# Patient Record
Sex: Female | Born: 1970 | Race: White | Hispanic: No | Smoking: Former smoker
Health system: Southern US, Community
[De-identification: ages and names within clinical notes are randomized; demographics above are authoritative.]

## PROBLEM LIST (undated history)

## (undated) DIAGNOSIS — F329 Major depressive disorder, single episode, unspecified: Secondary | ICD-10-CM

## (undated) DIAGNOSIS — J189 Pneumonia, unspecified organism: Secondary | ICD-10-CM

## (undated) DIAGNOSIS — E079 Disorder of thyroid, unspecified: Secondary | ICD-10-CM

## (undated) DIAGNOSIS — G43909 Migraine, unspecified, not intractable, without status migrainosus: Secondary | ICD-10-CM

## (undated) DIAGNOSIS — F32A Depression, unspecified: Secondary | ICD-10-CM

## (undated) DIAGNOSIS — R7303 Prediabetes: Secondary | ICD-10-CM

## (undated) HISTORY — PX: TONSILLECTOMY: SUR1361

## (undated) HISTORY — PX: CHOLECYSTECTOMY: SHX55

## (undated) HISTORY — PX: UPPER GASTROINTESTINAL ENDOSCOPY: SHX188

---
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
## 2009-07-30 HISTORY — PX: ANKLE SURGERY: SHX546

## 2019-05-16 ENCOUNTER — Emergency Department (HOSPITAL_BASED_OUTPATIENT_CLINIC_OR_DEPARTMENT_OTHER)
Admission: EM | Admit: 2019-05-16 | Discharge: 2019-05-16 | Disposition: A | Discharge status: Home / Self Care | Payer: Self-pay | Attending: Emergency Medicine | Admitting: Emergency Medicine

## 2019-05-16 ENCOUNTER — Emergency Department (HOSPITAL_BASED_OUTPATIENT_CLINIC_OR_DEPARTMENT_OTHER): Payer: Self-pay

## 2019-05-16 ENCOUNTER — Other Ambulatory Visit: Payer: Self-pay

## 2019-05-16 ENCOUNTER — Encounter (HOSPITAL_BASED_OUTPATIENT_CLINIC_OR_DEPARTMENT_OTHER): Payer: Self-pay | Admitting: Adult Health

## 2019-05-16 DIAGNOSIS — W010XXA Fall on same level from slipping, tripping and stumbling without subsequent striking against object, initial encounter: Secondary | ICD-10-CM | POA: Insufficient documentation

## 2019-05-16 DIAGNOSIS — S82892A Other fracture of left lower leg, initial encounter for closed fracture: Secondary | ICD-10-CM | POA: Insufficient documentation

## 2019-05-16 DIAGNOSIS — Y9301 Activity, walking, marching and hiking: Secondary | ICD-10-CM | POA: Insufficient documentation

## 2019-05-16 DIAGNOSIS — Y999 Unspecified external cause status: Secondary | ICD-10-CM | POA: Insufficient documentation

## 2019-05-16 DIAGNOSIS — Z87891 Personal history of nicotine dependence: Secondary | ICD-10-CM | POA: Insufficient documentation

## 2019-05-16 DIAGNOSIS — Y92828 Other wilderness area as the place of occurrence of the external cause: Secondary | ICD-10-CM | POA: Insufficient documentation

## 2019-05-16 MED ORDER — OXYCODONE-ACETAMINOPHEN 5-325 MG PO TABS: 1.0000 | ORAL_TABLET | ORAL | 0 refills | Status: DC | PRN

## 2019-05-16 MED ORDER — ONDANSETRON 4 MG PO TBDP: 4.0000 mg | ORAL_TABLET | Freq: Three times a day (TID) | ORAL | 0 refills | Status: AC | PRN

## 2019-05-16 MED ORDER — OXYCODONE-ACETAMINOPHEN 5-325 MG PO TABS
1.0000 | ORAL_TABLET | Freq: Once | ORAL | Status: AC
  Administered 2019-05-16: 18:00:00 1 via ORAL
  Filled 2019-05-16: qty 1

## 2019-05-16 MED ORDER — ONDANSETRON 4 MG PO TBDP
4.0000 mg | ORAL_TABLET | Freq: Once | ORAL | Status: AC
  Administered 2019-05-16: 18:00:00 4 mg via ORAL
  Filled 2019-05-16: qty 1

## 2019-05-16 NOTE — ED Notes (Signed)
Patient transported to CT 

## 2019-05-16 NOTE — Discharge Instructions (Signed)
You can take 1000 mg of Tylenol.  Do not exceed 4000 mg of Tylenol a day.  Take pain medications as directed for break through pain. Do not drive or operate machinery while taking this medication.   Do not get your splint wet.  Elevate the foot at home.  Use crutches as you should be nonweightbearing.  As we discussed, you will need to follow-up with referred orthopedic doctor.  Call his office and arrange for an appointment on Tuesday.  Return the emergency department for any worsening pain, numbness/weakness of the foot, discoloration of toes, fever or any other worsening or concerning symptoms.

## 2019-05-16 NOTE — ED Triage Notes (Signed)
Carolyn Green was hiking at [facility] and she had to jump over a 2 foot drop, she did not quit make the jump and landed on her left ankle. She has lateral ankle swlling and bruising. Pain is not severe unless foot is down or weight bearing.

## 2019-05-16 NOTE — ED Notes (Signed)
Pt states that she took 3 ibuprofen around 1130 today after injury. Pt was jumping from a stone stair while hiking and heard a pop. Denies hitting head or any LOC.

## 2019-05-16 NOTE — ED Provider Notes (Signed)
[facility] EMERGENCY DEPARTMENT Provider Note   CSN: [id number] Arrival date & time: 05/16/19  1700     History   Chief Complaint Chief Complaint  Patient presents with  . Ankle Pain    HPI Carolyn Green is a 48 y.o. female who presents for evaluation of left ankle pain after mechanical fall.  She reports that she was hiking and noted that there was a drop that was about 2 feet.  She reports that she jumped the drop and landed wrong, causing her ankle to turn.  Has not been able to ambulate or bear weight since the incident.  No preceding chest pain or dizziness ammeter fall.  She did not have any head injury, LOC.  She denies any numbness/weakness.     The history is provided by the patient.    History reviewed. No pertinent past medical history.  There are no active problems to display for this patient.   Past Surgical History:  Procedure Laterality Date  . ANKLE SURGERY    . CHOLECYSTECTOMY    . TONSILLECTOMY       OB History   No obstetric history on file.      Home Medications    Prior to Admission medications   Medication Sig Start Date End Date Taking? Authorizing Provider  ondansetron (ZOFRAN ODT) 4 MG disintegrating tablet Take 1 tablet (4 mg total) by mouth every 8 (eight) hours as needed for nausea or vomiting. 05/16/19   Maxwell CaulLayden, Keviana Guida A, PA-C  oxyCODONE-acetaminophen (PERCOCET/ROXICET) 5-325 MG tablet Take 1-2 tablets by mouth every 4 (four) hours as needed for severe pain. 05/16/19   Maxwell CaulLayden, Reshad Saab A, PA-C    Family History History reviewed. No pertinent family history.  Social History Social History   Tobacco Use  . Smoking status: Former Smoker  Substance Use Topics  . Alcohol use: Yes  . Drug use: Never     Allergies   Penicillins and Zyban [bupropion]   Review of Systems Review of Systems  Musculoskeletal:       Ankle pain  Neurological: Negative for weakness and numbness.  All other systems reviewed and are  negative.    Physical Exam Updated Vital Signs BP 114/72 (BP Location: Left Arm)   Pulse 73   Temp 98.1 F (36.7 C) (Oral)   Resp 16   Ht 5\' 7"  (1.702 m)   Wt 106.1 kg   SpO2 99%   BMI 36.65 kg/m   Physical Exam Vitals signs and nursing note reviewed.  Constitutional:      Appearance: She is well-developed.  HENT:     Head: Normocephalic and atraumatic.  Eyes:     General: No scleral icterus.       Right eye: No discharge.        Left eye: No discharge.     Conjunctiva/sclera: Conjunctivae normal.  Cardiovascular:     Pulses:          Dorsalis pedis pulses are 2+ on the right side and 2+ on the left side.  Pulmonary:     Effort: Pulmonary effort is normal.  Musculoskeletal:     Comments: Tenderness palpation noted to lateral and medial malleolus of left ankle with overlying soft tissue swelling.  Limited range of motion secondary to pain.  No palpable deformity or crepitus.  She is able to wiggle all 5 toes without any difficulty.  No tenderness palpation noted to proximal tib-fib, left knee, left hip.  No pelvic instability.  No tenderness  palpation of the right lower extremity.  Skin:    General: Skin is warm and dry.     Comments: Good distal cap refill. LLE is not dusky in appearance or cool to touch.  Neurological:     Mental Status: She is alert.     Comments: Sensation intact along major nerve distributions of BLE  Psychiatric:        Speech: Speech normal.        Behavior: Behavior normal.      ED Treatments / Results  Labs (all labs ordered are listed, but only abnormal results are displayed) Labs Reviewed - No data to display  EKG None  Radiology Dg Ankle Complete Left  Result Date: 05/16/2019 CLINICAL DATA:  Fall.  Pain. EXAM: LEFT ANKLE COMPLETE - 3+ VIEW COMPARISON:  None. FINDINGS: Two screws traverse the fibula, terminating in the tibia. The more inferior of these 2 screws is fractured. 2 screws traverse the medial malleolus as well. There is  a fracture of the fibula just distal to the screws described above. There is calcification in the soft tissue adjacent to the inferior tip of the medial malleolus consistent with an age indeterminate avulsion injury. There is soft tissue swelling, particularly laterally. The ankle mortise is intact. There is a subtle lucency through the base of the medial malleolus seen on oblique imaging. The screws traverse this lucency. IMPRESSION: 1. The inferior most screw traversing the fibula, terminating in the tibia is fractured. This is an age indeterminate finding. 2. There is a mildly displaced acute fracture through the distal fibula, just distal to the inferior-most fractured screw. 3. There is evidence of a medial malleolar avulsion injury which is age indeterminate. 4. A subtle lucency extends through the base of the medial malleolus. Two screws extend through this subtle lucency. It is possible this subtle lucency is from the patient's previously repaired fracture. Electronically Signed   By: Gerome Sam III M.D   On: 05/16/2019 17:56    Procedures Procedures (including critical care time)  Medications Ordered in ED Medications  ondansetron (ZOFRAN-ODT) disintegrating tablet 4 mg (4 mg Oral Given 05/16/19 1816)  oxyCODONE-acetaminophen (PERCOCET/ROXICET) 5-325 MG per tablet 1 tablet (1 tablet Oral Given 05/16/19 1816)     Initial Impression / Assessment and Plan / ED Course  I have reviewed the triage vital signs and the nursing notes.  Pertinent labs & imaging results that were available during my care of the patient were reviewed by me and considered in my medical decision making (see chart for details).        48 year old female who presents for evaluation of left ankle pain and swelling after mechanical fall.  Reports that she was hiking and missed a drop-off, causing her to land funny on her foot.  No head injury, LOC.  Has been unable to ambulate or bear weight since then. Patient is  afebrile, non-toxic appearing, sitting comfortably on examination table. Vital signs reviewed and stable. Patient is neurovascularly intact.  On exam, tenderness palpation noted to lateral and medial malleolus of left ankle with overlying soft tissue swelling.  Concern for fracture versus dislocation.  She has no proximal tib-fib tenderness or hip tenderness.  X-rays ordered at triage.  X-ray of ankle shows the inferior most screw the transverse the fibula and ends in the tibia is fractured.  Questionable age though.  She has an acute fracture through the distal fibula and a medial malleolar avulsion injury.  She had the surgery done in  [place] in 2011 or 2012.  She does not follow-up with our orthopedic and now lives in [place] area.  Consult orthopedics.  Discussed patient with Dr. Griffin Basil (Ortho).  He would like patient to be nonweightbearing with posterior splinting with slab.  He wants patient to follow-up in his office on Tuesday.  He would like a CT scan done here in the ED before she discharged.  Reevaluation after splint placement.  Patient with good distal sensation and cap refill.  Patient is hemodynamically stable.  Encourage patient to follow-up with Ortho as directed. At this time, patient exhibits no emergent life-threatening condition that require further evaluation in ED or admission. Patient had ample opportunity for questions and discussion. All patient's questions were answered with full understanding. Strict return precautions discussed. Patient expresses understanding and agreement to plan.   Portions of this note were generated with Lobby[employer]. Dictation errors may occur despite best attempts at proofreading.   Final Clinical Impressions(s) / ED Diagnoses   Final diagnoses:  Closed fracture of left ankle, initial encounter    ED Discharge Orders         Ordered    ondansetron (ZOFRAN ODT) 4 MG disintegrating tablet  Every 8 hours PRN     05/16/19 1919     oxyCODONE-acetaminophen (PERCOCET/ROXICET) 5-325 MG tablet  Every 4 hours PRN     05/16/19 1919           Desma Mcgregor 05/16/19 1920    Drenda Freeze, MD 05/16/19 [id number]

## 2019-05-19 ENCOUNTER — Other Ambulatory Visit (HOSPITAL_COMMUNITY)
Admission: RE | Admit: 2019-05-19 | Discharge: 2019-05-19 | Disposition: A | Discharge status: Home / Self Care | Payer: Medicaid Other | Source: Ambulatory Visit | Attending: Orthopaedic Surgery | Admitting: Orthopaedic Surgery

## 2019-05-19 ENCOUNTER — Encounter (HOSPITAL_COMMUNITY): Payer: Self-pay | Admitting: Emergency Medicine

## 2019-05-19 ENCOUNTER — Other Ambulatory Visit: Payer: Self-pay

## 2019-05-19 DIAGNOSIS — Z20828 Contact with and (suspected) exposure to other viral communicable diseases: Secondary | ICD-10-CM | POA: Insufficient documentation

## 2019-05-19 DIAGNOSIS — Z01812 Encounter for preprocedural laboratory examination: Secondary | ICD-10-CM | POA: Insufficient documentation

## 2019-05-19 LAB — SARS CORONAVIRUS 2 (TAT 6-24 HRS): SARS Coronavirus 2: NEGATIVE

## 2019-05-19 NOTE — H&P (Signed)
PREOPERATIVE H&P  Chief Complaint: LEFT DISTAL FIBULA FRACTURE  HPI: Carolyn Green is a 48 y.o. female who presents for preoperative history and physical prior to scheduled surgery, OPEN REDUCTION INTERNAL FIXATION (ORIF) ANKLE FRACTURE with HARDWARE REMOVAL.  She had previous history of ORIF left ankle by an orthopedic surgery in [place], [place] in 2011/2012 per the patient. Patient presented to the ED on 05/16/19 following a mechanical fall. She was unable to ambulate or bear weight following the fall. She denied any other injury. She was placed in a posterior splint and instructed to remain NWB on her LLE.   Patient was seen in clinic on 05/19/2019. Her symptoms are rated as moderate to severe, and have been worsening.  This is significantly impairing activities of daily living.   Please see clinic note for further details on this patient's care.    She has elected for surgical management.   No past medical history on file. Past Surgical History:  Procedure Laterality Date  . ANKLE SURGERY    . CHOLECYSTECTOMY    . TONSILLECTOMY     Social History   Socioeconomic History  . Marital status: Widowed    Spouse name: Not on file  . Number of children: Not on file  . Years of education: Not on file  . Highest education level: Not on file  Occupational History  . Not on file  Social Needs  . Financial resource strain: Not on file  . Food insecurity    Worry: Not on file    Inability: Not on file  . Transportation needs    Medical: Not on file    Non-medical: Not on file  Tobacco Use  . Smoking status: Former Smoker  Substance and Sexual Activity  . Alcohol use: Yes  . Drug use: Never  . Sexual activity: Not on file  Lifestyle  . Physical activity    Days per week: Not on file    Minutes per session: Not on file  . Stress: Not on file  Relationships  . Social Herbalist on phone: Not on file    Gets together: Not on file    Attends religious service: Not on  file    Active member of club or organization: Not on file    Attends meetings of clubs or organizations: Not on file    Relationship status: Not on file  Other Topics Concern  . Not on file  Social History Narrative  . Not on file   No family history on file. Allergies  Allergen Reactions  . Penicillins   . Zyban [Bupropion]    Prior to Admission medications   Medication Sig Start Date End Date Taking? Authorizing Provider  ondansetron (ZOFRAN ODT) 4 MG disintegrating tablet Take 1 tablet (4 mg total) by mouth every 8 (eight) hours as needed for nausea or vomiting. 05/16/19   Volanda Napoleon, PA-C  oxyCODONE-acetaminophen (PERCOCET/ROXICET) 5-325 MG tablet Take 1-2 tablets by mouth every 4 (four) hours as needed for severe pain. 05/16/19   Volanda Napoleon, PA-C    Positive ROS: All other systems have been reviewed and were otherwise negative with the exception of those mentioned in the HPI and as above.  Physical Exam: General: Alert, no acute distress Cardiovascular: No pedal edema Respiratory: No cyanosis, no use of accessory musculature GI: No organomegaly, abdomen is soft and non-tender Skin: No lesions in the area of chief complaint Neurologic: Sensation intact distally Psychiatric: Patient is competent for consent  with normal mood and affect Lymphatic: No axillary or cervical lymphadenopathy  MUSCULOSKELETAL: splint CDI, +EHL though remainder of motor difficult to test due to splint, sensation intact distally with warm well perfused foot  Imaging: X-rays and CT images were reviewed. Images demonstrate Status post screw fixation of the medial malleolus and across the syndesmosis. The inferior-most syndesmotic screw appears to be fractured. Acute comminuted mildly displaced distal fibular fracture at the level of the ankle mortise. Tiny anterior, medial malleolus avulsion injury.  Assessment: LEFT DISTAL FIBULA FRACTURE  Plan: Plan for Procedure(s): OPEN REDUCTION  INTERNAL FIXATION (ORIF) ANKLE FRACTURE HARDWARE REMOVAL  The risks benefits and alternatives were discussed with the patient including but not limited to the risks of nonoperative treatment, versus surgical intervention including infection, bleeding, nerve injury,  blood clots, cardiopulmonary complications, morbidity, mortality, among others, and they were willing to proceed.   The patient acknowledged the explanation, agreed to proceed with the plan and consent was signed.   Jerald Kief [place], [place]  05/19/2019 2:17 PM

## 2019-05-19 NOTE — Progress Notes (Addendum)
PCP - Marda Stalker, PA-C Cardiologist -   Chest x-ray -  EKG -  Stress Test -  ECHO -  Cardiac Cath -   Sleep Study -  CPAP - hx of OSA, was prescribed CPAP but does not use   Fasting Blood Sugar - prediabetic  Checks Blood Sugar ___0__ times a day  Blood Thinner Instructions: Aspirin Instructions: Last Dose:  Anesthesia review:   N/A  Patient denies shortness of breath, fever, cough and chest pain during preop phone call    Patient verbalized understanding of instructions that were given to them during preop phone call . Patient was also instructed that they will need to review over the PAT instructions again at home before surgery.

## 2019-05-19 NOTE — Progress Notes (Signed)
Pre-op phone  call attempted. No answer. Lmtcb

## 2019-05-20 ENCOUNTER — Ambulatory Visit (HOSPITAL_COMMUNITY): Payer: Self-pay | Admitting: Certified Registered Nurse Anesthetist

## 2019-05-20 ENCOUNTER — Encounter (HOSPITAL_COMMUNITY)
Admission: RE | Disposition: A | Discharge status: Home / Self Care | Payer: Self-pay | Source: Home / Self Care | Attending: Orthopaedic Surgery

## 2019-05-20 ENCOUNTER — Encounter (HOSPITAL_COMMUNITY): Payer: Self-pay | Admitting: Certified Registered Nurse Anesthetist

## 2019-05-20 ENCOUNTER — Ambulatory Visit (HOSPITAL_COMMUNITY)
Admission: RE | Admit: 2019-05-20 | Discharge: 2019-05-20 | Disposition: A | Discharge status: Home / Self Care | Payer: Self-pay | Attending: Orthopaedic Surgery | Admitting: Orthopaedic Surgery

## 2019-05-20 DIAGNOSIS — S82832A Other fracture of upper and lower end of left fibula, initial encounter for closed fracture: Secondary | ICD-10-CM | POA: Insufficient documentation

## 2019-05-20 DIAGNOSIS — Z7982 Long term (current) use of aspirin: Secondary | ICD-10-CM | POA: Insufficient documentation

## 2019-05-20 DIAGNOSIS — T84117A Breakdown (mechanical) of internal fixation device of bone of left lower leg, initial encounter: Secondary | ICD-10-CM | POA: Insufficient documentation

## 2019-05-20 DIAGNOSIS — Z87891 Personal history of nicotine dependence: Secondary | ICD-10-CM | POA: Insufficient documentation

## 2019-05-20 DIAGNOSIS — Z9889 Other specified postprocedural states: Secondary | ICD-10-CM | POA: Insufficient documentation

## 2019-05-20 DIAGNOSIS — W19XXXA Unspecified fall, initial encounter: Secondary | ICD-10-CM | POA: Insufficient documentation

## 2019-05-20 DIAGNOSIS — Z79890 Hormone replacement therapy (postmenopausal): Secondary | ICD-10-CM | POA: Insufficient documentation

## 2019-05-20 DIAGNOSIS — F329 Major depressive disorder, single episode, unspecified: Secondary | ICD-10-CM | POA: Insufficient documentation

## 2019-05-20 DIAGNOSIS — Y831 Surgical operation with implant of artificial internal device as the cause of abnormal reaction of the patient, or of later complication, without mention of misadventure at the time of the procedure: Secondary | ICD-10-CM | POA: Insufficient documentation

## 2019-05-20 DIAGNOSIS — Z791 Long term (current) use of non-steroidal anti-inflammatories (NSAID): Secondary | ICD-10-CM | POA: Insufficient documentation

## 2019-05-20 DIAGNOSIS — E039 Hypothyroidism, unspecified: Secondary | ICD-10-CM | POA: Insufficient documentation

## 2019-05-20 DIAGNOSIS — Z7984 Long term (current) use of oral hypoglycemic drugs: Secondary | ICD-10-CM | POA: Insufficient documentation

## 2019-05-20 DIAGNOSIS — Z79899 Other long term (current) drug therapy: Secondary | ICD-10-CM | POA: Insufficient documentation

## 2019-05-20 HISTORY — DX: Depression, unspecified: F32.A

## 2019-05-20 HISTORY — DX: Migraine, unspecified, not intractable, without status migrainosus: G43.909

## 2019-05-20 HISTORY — PX: HARDWARE REMOVAL: SHX979

## 2019-05-20 HISTORY — DX: Prediabetes: R73.03

## 2019-05-20 HISTORY — DX: Disorder of thyroid, unspecified: E07.9

## 2019-05-20 HISTORY — PX: ORIF ANKLE FRACTURE: SHX5408

## 2019-05-20 LAB — BASIC METABOLIC PANEL
Anion gap: 12 (ref 5–15)
BUN: 12 mg/dL (ref 6–20)
CO2: 21 mmol/L — ABNORMAL LOW (ref 22–32)
Calcium: 8.9 mg/dL (ref 8.9–10.3)
Chloride: 105 mmol/L (ref 98–111)
Creatinine, Ser: 0.82 mg/dL (ref 0.44–1.00)
GFR calc Af Amer: >60 mL/min (ref >60)
GFR calc non Af Amer: >60 mL/min (ref >60)
Glucose, Bld: 122 mg/dL — ABNORMAL HIGH (ref 70–99)
Potassium: 4.5 mmol/L (ref 3.5–5.1)
Sodium: 138 mmol/L (ref 135–145)

## 2019-05-20 LAB — CBC
HCT: 41 % (ref 36.0–46.0)
Hemoglobin: 12.9 g/dL (ref 12.0–15.0)
MCH: 28.9 pg (ref 26.0–34.0)
MCHC: 31.5 g/dL (ref 30.0–36.0)
MCV: 91.9 fL (ref 80.0–100.0)
Platelets: 253 10*3/uL (ref 150–400)
RBC: 4.46 MIL/uL (ref 3.87–5.11)
RDW: 13.4 % (ref 11.5–15.5)
WBC: 7.5 10*3/uL (ref 4.0–10.5)
nRBC: 0 % (ref 0.0–0.2)

## 2019-05-20 LAB — HEMOGLOBIN A1C
Hgb A1c MFr Bld: 6 % — ABNORMAL HIGH (ref 4.8–5.6)
Mean Plasma Glucose: 125.5 mg/dL

## 2019-05-20 SURGERY — OPEN REDUCTION INTERNAL FIXATION (ORIF) ANKLE FRACTURE
Anesthesia: General | Site: Ankle | Laterality: Left

## 2019-05-20 MED ORDER — DEXAMETHASONE SODIUM PHOSPHATE 10 MG/ML IJ SOLN
INTRAMUSCULAR | Status: AC
  Filled 2019-05-20: qty 1

## 2019-05-20 MED ORDER — HYDROMORPHONE HCL 1 MG/ML IJ SOLN: 0.2500 mg | INTRAMUSCULAR | Status: DC | PRN

## 2019-05-20 MED ORDER — ACETAMINOPHEN 500 MG PO TABS
1000.0000 mg | ORAL_TABLET | Freq: Once | ORAL | Status: DC
  Filled 2019-05-20: qty 2

## 2019-05-20 MED ORDER — FENTANYL CITRATE (PF) 100 MCG/2ML IJ SOLN
INTRAMUSCULAR | Status: AC
  Filled 2019-05-20: qty 2

## 2019-05-20 MED ORDER — CHLORHEXIDINE GLUCONATE 4 % EX LIQD: 60.0000 mL | Freq: Once | CUTANEOUS | Status: DC

## 2019-05-20 MED ORDER — PROPOFOL 10 MG/ML IV BOLUS
INTRAVENOUS | Status: DC | PRN
  Administered 2019-05-20: 200 mg via INTRAVENOUS

## 2019-05-20 MED ORDER — OXYCODONE HCL 5 MG PO TABS: ORAL_TABLET | ORAL | 0 refills | Status: AC

## 2019-05-20 MED ORDER — VANCOMYCIN HCL 1000 MG IV SOLR
INTRAVENOUS | Status: AC
  Filled 2019-05-20: qty 1000

## 2019-05-20 MED ORDER — OXYCODONE HCL 5 MG PO TABS: 5.0000 mg | ORAL_TABLET | Freq: Once | ORAL | Status: DC | PRN

## 2019-05-20 MED ORDER — PROPOFOL 10 MG/ML IV BOLUS
INTRAVENOUS | Status: AC
  Filled 2019-05-20: qty 20

## 2019-05-20 MED ORDER — 0.9 % SODIUM CHLORIDE (POUR BTL) OPTIME
TOPICAL | Status: DC | PRN
  Administered 2019-05-20: 11:00:00 1000 mL

## 2019-05-20 MED ORDER — ONDANSETRON HCL 4 MG/2ML IJ SOLN
INTRAMUSCULAR | Status: DC | PRN
  Administered 2019-05-20: 4 mg via INTRAVENOUS

## 2019-05-20 MED ORDER — MELOXICAM 7.5 MG PO TABS: 7.5000 mg | ORAL_TABLET | Freq: Every day | ORAL | 2 refills | Status: AC

## 2019-05-20 MED ORDER — ACETAMINOPHEN 500 MG PO TABS: 1000.0000 mg | ORAL_TABLET | Freq: Three times a day (TID) | ORAL | 0 refills | Status: AC

## 2019-05-20 MED ORDER — OXYCODONE HCL 5 MG/5ML PO SOLN: 5.0000 mg | Freq: Once | ORAL | Status: DC | PRN

## 2019-05-20 MED ORDER — BUPIVACAINE HCL 0.25 % IJ SOLN
INTRAMUSCULAR | Status: AC
  Filled 2019-05-20: qty 1

## 2019-05-20 MED ORDER — BUPIVACAINE HCL 0.25 % IJ SOLN
INTRAMUSCULAR | Status: DC | PRN
  Administered 2019-05-20: 10 mL

## 2019-05-20 MED ORDER — EPHEDRINE SULFATE-NACL 50-0.9 MG/10ML-% IV SOSY
PREFILLED_SYRINGE | INTRAVENOUS | Status: DC | PRN
  Administered 2019-05-20 (×3): 10 mg via INTRAVENOUS

## 2019-05-20 MED ORDER — PROMETHAZINE HCL 25 MG/ML IJ SOLN: 6.2500 mg | INTRAMUSCULAR | Status: DC | PRN

## 2019-05-20 MED ORDER — MIDAZOLAM HCL 2 MG/2ML IJ SOLN
INTRAMUSCULAR | Status: AC
  Filled 2019-05-20: qty 2

## 2019-05-20 MED ORDER — VANCOMYCIN HCL 1000 MG IV SOLR
INTRAVENOUS | Status: DC | PRN
  Administered 2019-05-20: 1000 mg

## 2019-05-20 MED ORDER — ASPIRIN 81 MG PO CHEW: 81.0000 mg | CHEWABLE_TABLET | Freq: Two times a day (BID) | ORAL | 1 refills | Status: AC

## 2019-05-20 MED ORDER — LACTATED RINGERS IV SOLN
INTRAVENOUS | Status: DC | PRN
  Administered 2019-05-20: 11:00:00 via INTRAVENOUS

## 2019-05-20 MED ORDER — MIDAZOLAM HCL 2 MG/2ML IJ SOLN
INTRAMUSCULAR | Status: DC | PRN
  Administered 2019-05-20: 2 mg via INTRAVENOUS

## 2019-05-20 MED ORDER — ONDANSETRON HCL 4 MG PO TABS: 4.0000 mg | ORAL_TABLET | Freq: Three times a day (TID) | ORAL | 1 refills | Status: AC | PRN

## 2019-05-20 MED ORDER — LIDOCAINE 2% (20 MG/ML) 5 ML SYRINGE
INTRAMUSCULAR | Status: DC | PRN
  Administered 2019-05-20: 80 mg via INTRAVENOUS

## 2019-05-20 MED ORDER — DEXAMETHASONE SODIUM PHOSPHATE 4 MG/ML IJ SOLN
INTRAMUSCULAR | Status: DC | PRN
  Administered 2019-05-20: 5 mg via INTRAVENOUS

## 2019-05-20 MED ORDER — KETOROLAC TROMETHAMINE 30 MG/ML IJ SOLN: 30.0000 mg | Freq: Once | INTRAMUSCULAR | Status: DC | PRN

## 2019-05-20 MED ORDER — FENTANYL CITRATE (PF) 100 MCG/2ML IJ SOLN
INTRAMUSCULAR | Status: DC | PRN
  Administered 2019-05-20 (×4): 50 ug via INTRAVENOUS

## 2019-05-20 MED ORDER — CLINDAMYCIN PHOSPHATE 900 MG/50ML IV SOLN
900.0000 mg | INTRAVENOUS | Status: AC
  Administered 2019-05-20: 900 mg via INTRAVENOUS
  Filled 2019-05-20: qty 50

## 2019-05-20 MED ORDER — ONDANSETRON HCL 4 MG/2ML IJ SOLN
INTRAMUSCULAR | Status: AC
  Filled 2019-05-20: qty 2

## 2019-05-20 SURGICAL SUPPLY — 103 items
3.0X18 CORTICAL SCREW (Orthopedic Implant) ×1 IMPLANT
BANDAGE ESMARK 6X9 LF (GAUZE/BANDAGES/DRESSINGS) ×2 IMPLANT
BENZOIN TINCTURE PRP APPL 2/3 (GAUZE/BANDAGES/DRESSINGS) IMPLANT
BIT DRILL 2 CANN GRADUATED (BIT) ×2 IMPLANT
BIT DRILL 2.5 CANN LNG (BIT) ×2 IMPLANT
BIT DRILL 2.5 CANN STRL (BIT) ×2 IMPLANT
BLADE SURG 15 STRL LF DISP TIS (BLADE) ×4 IMPLANT
BLADE SURG 15 STRL SS (BLADE) ×4
BLADE SURG SZ10 CARB STEEL (BLADE) ×4 IMPLANT
BNDG COHESIVE 4X5 TAN STRL (GAUZE/BANDAGES/DRESSINGS) ×4 IMPLANT
BNDG ELASTIC 4X5.8 VLCR STR LF (GAUZE/BANDAGES/DRESSINGS) ×4 IMPLANT
BNDG ELASTIC 6X15 VLCR STRL LF (GAUZE/BANDAGES/DRESSINGS) ×2 IMPLANT
BNDG ELASTIC 6X5.8 VLCR STR LF (GAUZE/BANDAGES/DRESSINGS) ×4 IMPLANT
BNDG ESMARK 6X9 LF (GAUZE/BANDAGES/DRESSINGS) ×4
CHLORAPREP W/TINT 26 (MISCELLANEOUS) ×2 IMPLANT
CLEANER CAUTERY TIP 5X5 PAD (MISCELLANEOUS) ×2 IMPLANT
CLEANER TIP ELECTROSURG 2X2 (MISCELLANEOUS) ×2 IMPLANT
CLOSURE WOUND 1/2 X4 (GAUZE/BANDAGES/DRESSINGS) ×1
COVER BACK TABLE 60X90IN (DRAPES) ×2 IMPLANT
COVER MAYO STAND STRL (DRAPES) ×4 IMPLANT
COVER SURGICAL LIGHT HANDLE (MISCELLANEOUS) ×4 IMPLANT
COVER WAND RF STERILE (DRAPES) ×4 IMPLANT
CUFF TOURN SGL QUICK 24 (TOURNIQUET CUFF) ×2
CUFF TOURN SGL QUICK 34 (TOURNIQUET CUFF)
CUFF TRNQT CYL 24X4X16.5-23 (TOURNIQUET CUFF) IMPLANT
CUFF TRNQT CYL 34X4.125X (TOURNIQUET CUFF) IMPLANT
DECANTER SPIKE VIAL GLASS SM (MISCELLANEOUS) IMPLANT
DRAPE EXTREMITY T 121X128X90 (DISPOSABLE) ×2 IMPLANT
DRAPE IMP U-DRAPE 54X76 (DRAPES) ×2 IMPLANT
DRAPE OEC MINIVIEW 54X84 (DRAPES) ×4 IMPLANT
DRAPE ORTHO SPLIT 77X108 STRL (DRAPES) ×4
DRAPE SHEET LG 3/4 BI-LAMINATE (DRAPES) ×8 IMPLANT
DRAPE SURG ORHT 6 SPLT 77X108 (DRAPES) ×4 IMPLANT
DRAPE U-SHAPE 47X51 STRL (DRAPES) ×4 IMPLANT
DRSG ADAPTIC 3X8 NADH LF (GAUZE/BANDAGES/DRESSINGS) IMPLANT
DRSG PAD ABDOMINAL 8X10 ST (GAUZE/BANDAGES/DRESSINGS) ×4 IMPLANT
DURAPREP 26ML APPLICATOR (WOUND CARE) ×2 IMPLANT
ELECT PENCIL ROCKER SW 15FT (MISCELLANEOUS) ×2 IMPLANT
ELECT REM PT RETURN 15FT ADLT (MISCELLANEOUS) ×4 IMPLANT
GAUZE SPONGE 4X4 12PLY STRL (GAUZE/BANDAGES/DRESSINGS) ×4 IMPLANT
GLOVE BIO SURGEON STRL SZ 6.5 (GLOVE) ×6 IMPLANT
GLOVE BIO SURGEON STRL SZ8 (GLOVE) ×4 IMPLANT
GLOVE BIO SURGEONS STRL SZ 6.5 (GLOVE) ×2
GLOVE BIOGEL PI IND STRL 6.5 (GLOVE) ×2 IMPLANT
GLOVE BIOGEL PI IND STRL 8 (GLOVE) ×4 IMPLANT
GLOVE BIOGEL PI INDICATOR 6.5 (GLOVE) ×2
GLOVE BIOGEL PI INDICATOR 8 (GLOVE) ×4
GLOVE ECLIPSE 8.0 STRL XLNG CF (GLOVE) ×4 IMPLANT
GOWN STRL REUS W/ TWL LRG LVL3 (GOWN DISPOSABLE) ×4 IMPLANT
GOWN STRL REUS W/TWL LRG LVL3 (GOWN DISPOSABLE) ×8 IMPLANT
GOWN STRL REUS W/TWL XL LVL3 (GOWN DISPOSABLE) ×4 IMPLANT
KIT BASIN OR (CUSTOM PROCEDURE TRAY) ×4 IMPLANT
KIT TURNOVER KIT A (KITS) IMPLANT
MANIFOLD NEPTUNE II (INSTRUMENTS) ×4 IMPLANT
NDL HYPO 21X1.5 SAFETY (NEEDLE) IMPLANT
NDL HYPO 25X1 1.5 SAFETY (NEEDLE) ×2 IMPLANT
NEEDLE HYPO 21X1.5 SAFETY (NEEDLE) ×4 IMPLANT
NEEDLE HYPO 22GX1.5 SAFETY (NEEDLE) IMPLANT
NEEDLE HYPO 25X1 1.5 SAFETY (NEEDLE) IMPLANT
NS IRRIG 1000ML POUR BTL (IV SOLUTION) ×4 IMPLANT
PACK ORTHO EXTREMITY (CUSTOM PROCEDURE TRAY) ×4 IMPLANT
PAD ARMBOARD 7.5X6 YLW CONV (MISCELLANEOUS) ×8 IMPLANT
PAD CAST 4YDX4 CTTN HI CHSV (CAST SUPPLIES) ×2 IMPLANT
PAD CLEANER CAUTERY TIP 5X5 (MISCELLANEOUS) ×2
PADDING CAST ABS 4INX4YD NS (CAST SUPPLIES) ×4
PADDING CAST ABS COTTON 4X4 ST (CAST SUPPLIES) ×4 IMPLANT
PADDING CAST COTTON 4X4 STRL (CAST SUPPLIES) ×2
PADDING CAST COTTON 6X4 STRL (CAST SUPPLIES) ×4 IMPLANT
PLATE DST LCK RT H4 (Plate) ×2 IMPLANT
SCREW CAN THREAD 3X18 (Screw) ×2 IMPLANT
SCREW LOCK T10 FT 18X2.7X (Screw) IMPLANT
SCREW LOCK T15 FT 14X3.5XST (Screw) IMPLANT
SCREW LOCKING 2.7X14MM (Screw) ×4 IMPLANT
SCREW LOCKING 2.7X18MM (Screw) ×2 IMPLANT
SCREW LOCKING 3.5X14MM (Screw) ×2 IMPLANT
SCREW LOW PROFILE 3.5X14 (Screw) ×2 IMPLANT
SCREW LOW PROFILE 3.5X16 (Screw) ×2 IMPLANT
SLEEVE SCD COMPRESS KNEE MED (MISCELLANEOUS) IMPLANT
SPLINT PLASTER CAST XFAST 5X30 (CAST SUPPLIES) IMPLANT
SPLINT PLASTER XFAST SET 5X30 (CAST SUPPLIES) ×40
SPONGE LAP 18X18 RF (DISPOSABLE) ×2 IMPLANT
STAPLER VISISTAT 35W (STAPLE) ×2 IMPLANT
STRIP CLOSURE SKIN 1/2X4 (GAUZE/BANDAGES/DRESSINGS) ×3 IMPLANT
SUCTION FRAZIER HANDLE 10FR (MISCELLANEOUS) ×2
SUCTION TUBE FRAZIER 10FR DISP (MISCELLANEOUS) ×2 IMPLANT
SUT ETHILON 3 0 PS 1 (SUTURE) ×8 IMPLANT
SUT MNCRL AB 4-0 PS2 18 (SUTURE) IMPLANT
SUT MON AB 3-0 SH 27 (SUTURE)
SUT MON AB 3-0 SH27 (SUTURE) IMPLANT
SUT VIC AB 0 CT1 27 (SUTURE) ×2
SUT VIC AB 0 CT1 27XBRD ANBCTR (SUTURE) ×2 IMPLANT
SUT VIC AB 2-0 CT1 27 (SUTURE) ×4
SUT VIC AB 2-0 CT1 TAPERPNT 27 (SUTURE) ×4 IMPLANT
SUT VIC AB 3-0 SH 27 (SUTURE)
SUT VIC AB 3-0 SH 27X BRD (SUTURE) ×2 IMPLANT
SYR BULB 3OZ (MISCELLANEOUS) ×4 IMPLANT
SYR CONTROL 10ML LL (SYRINGE) ×4 IMPLANT
TOWEL OR 17X26 10 PK STRL BLUE (TOWEL DISPOSABLE) ×12 IMPLANT
TUBING CONNECTING 10 (TUBING) ×2 IMPLANT
TUBING CONNECTING 10' (TUBING)
UNDERPAD 30X36 HEAVY ABSORB (UNDERPADS AND DIAPERS) ×2 IMPLANT
WATER STERILE IRR 1000ML POUR (IV SOLUTION) ×2 IMPLANT
YANKAUER SUCT BULB TIP NO VENT (SUCTIONS) ×4 IMPLANT

## 2019-05-20 NOTE — Anesthesia Postprocedure Evaluation (Signed)
Anesthesia Post Note  Patient: Carolyn Green  Procedure(s) Performed: OPEN REDUCTION INTERNAL FIXATION (ORIF) ANKLE FRACTURE (Left Ankle) HARDWARE REMOVAL (Left )     Patient location during evaluation: PACU Anesthesia Type: General Level of consciousness: awake and alert Pain management: pain level controlled Vital Signs Assessment: post-procedure vital signs reviewed and stable Respiratory status: spontaneous breathing, nonlabored ventilation, respiratory function stable and patient connected to nasal cannula oxygen Cardiovascular status: blood pressure returned to baseline and stable Postop Assessment: no apparent nausea or vomiting Anesthetic complications: no    Last Vitals:  Vitals:   05/20/19 1245 05/20/19 1300  BP: 116/70 121/65  Pulse: 66 70  Resp: 13 18  Temp: (!) 36.3 C (!) 36.3 C  SpO2: 98% 95%    Last Pain:  Vitals:   05/20/19 1300  PainSc: 3                  Tenelle Andreason P Holli Rengel

## 2019-05-20 NOTE — Anesthesia Procedure Notes (Signed)
Procedure Name: LMA Insertion Date/Time: 05/20/2019 10:55 AM Performed by: Claudia Desanctis, CRNA Pre-anesthesia Checklist: Emergency Drugs available, Patient identified, Suction available and Patient being monitored Patient Re-evaluated:Patient Re-evaluated prior to induction Oxygen Delivery Method: Circle system utilized Preoxygenation: Pre-oxygenation with 100% oxygen Induction Type: IV induction Ventilation: Mask ventilation without difficulty LMA: LMA inserted LMA Size: 4.0 Number of attempts: 1 Placement Confirmation: positive ETCO2 and breath sounds checked- equal and bilateral Tube secured with: Tape Dental Injury: Teeth and Oropharynx as per pre-operative assessment

## 2019-05-20 NOTE — Op Note (Signed)
Orthopaedic Surgery Operative Note (CSN: [id number])  Carolyn Green  November 04, 1970 Date of Surgery: 05/20/2019   Diagnoses:  Left periprosthetic distal fibula fracture  Procedure: Left ankle removal of hardware Left ankle distal fibular open Dr. Internal fixation   Operative Finding Successful completion of the planned procedure.  Patient's bone quality was adequate.  She had fractured at her distalmost syndesmosis screw which unfortunately had fractured itself prior to the procedure.  There is retained hardware obviously in the medial malleolus as well as in the distal fibula but we did not feel that it was warranted to remove this hardware as it may have caused undue damage to surrounding bone and tissue.  This would provide little benefit to the patient.  We did remove the proximal medicine as necessary without issue.  Good fixation with a distal fibular locking plate.  Post-operative plan: The patient will be nonweightbearing in the splint for 1 week transition to a boot afterwards with likely weightbearing starting at 3 to 4 weeks..  The patient will be discharged home.  DVT prophylaxis Aspirin 81 mg twice daily for 6 weeks.   Pain control with PRN pain medication preferring oral medicines.  Follow up plan will be scheduled in approximately 7 days for incision check and XR.  Post-Op Diagnosis: Same Surgeons:Primary: Hiram Gash, MD Assistants:Caroline McBane PA-C Location: [facility] Anesthesia: General with local anesthesia Antibiotics: Ancef 2 g with local vancomycin powder 1 g at the surgical site Tourniquet time:  Total Tourniquet Time Documented: Thigh (Left) - 46 minutes Total: Thigh (Left) - 46 minutes  Estimated Blood Loss: Minimal Complications: None Specimens: None Implants: Implant Name Type Inv. Item Serial No. Manufacturer Lot No. LRB No. Used Action  SCREW LOW PROFILE 3.5X14 - [id number] Screw SCREW LOW PROFILE 3.5X14  [employer]  Left 1 Implanted  SCREW LOCKING  2.7X14MM - [id number] Screw SCREW LOCKING 2.7X14MM  [employer]  Left 2 Implanted  3.0X18 CORTICAL SCREW Orthopedic Implant   [employer]  Left 1 Implanted  SCREW LOCKING 2.7X18MM - [id number] Screw SCREW LOCKING 2.7X18MM  [employer]  Left 1 Implanted  SCREW LOCKING 3.5X14MM - [id number] Screw SCREW LOCKING 3.5X14MM  [employer]  Left 1 Implanted  SCREW LOW PROFILE 3.5X16 - [id number] Screw SCREW LOW PROFILE 3.5X16  [employer]  Left 1 Implanted  PLATE DST LCK RT H4 - [id number] Plate PLATE DST LCK RT H4  [employer]  Left 1 Implanted    Indications for Surgery:   Carolyn Green is a 48 y.o. female with fall after previous history of an outside surgeon performing a syndesmosis fixation with 2 percutaneously placed screws.  The patient unfortunately fell and had a periprosthetic fracture just distal to her percutaneously placed screws.  She did have widening of her ankle mortise and in this relatively active young patient we felt that surgical management would better align the joint and removal of the syndesmosis screws to be necessary to plate the fibula..  Benefits and risks of operative and nonoperative management were discussed prior to surgery with patient/guardian(s) and informed consent form was completed.  Specific risks including infection, need for additional surgery, periprosthetic fracture, nonunion, malunion and need for the surgery.   Procedure:   The patient was identified properly. Informed consent was obtained and the surgical site was marked. The patient was taken up to suite where general anesthesia was induced.  The patient was positioned supine on a regular bed with a bone foam.  The left ankle was prepped  and draped in the usual sterile fashion.  Timeout was performed before the beginning of the case.  Tourniquet was used for the above duration.  We began with a extension of the previous incision over the lateral aspect of the fibula.  We carried the incision through skin sharply  achieving hemostasis to get progress.  In a separate layer we were able to incise the soft tissue overlying the fibula.  Immediately encountered the most proximal syndesmosis screw which was removed with a small frag screwdriver without issue.  At that point we examine the fracture line and noted a short oblique fracture that was not amenable to lag screw fixation.  The other syndesmosis screw had fractured prior to arrival in the operating room and we withdrew the lateral portion of the screw but left the medial portion buried in the tibia.  This was within the fracture site itself.  At that point we used point-to-point reduction clamp to hold the fracture in anatomic alignment checking images on fluoroscopy.  We then placed a distal 4-hole [employer] locking plate and obtained good purchase with a nonlocking screw proximal to the fracture and then placed 1 nonlocking and 3 locking screws distal to the fracture.  We then completed our fixation with 2 additional screws proximal.  Final fluoroscopic images demonstrate intact mortise with anatomic reduction of the fibula and stress views demonstrated no widening of the syndesmosis.  We irrigated the incision copiously before placing local vancomycin powder and closed in a multilayer fashion with using nylon sutures and the final layer.  Sterile dressing well-padded short leg splint replaced.  Patient awoken taken to the PACU in stable condition. Alfonse Alpers, PA-C, present and scrubbed throughout the case, critical for completion in a timely fashion, and for retraction, instrumentation, closure.

## 2019-05-20 NOTE — Transfer of Care (Signed)
Immediate Anesthesia Transfer of Care Note  Patient: Carolyn Green  Procedure(s) Performed: OPEN REDUCTION INTERNAL FIXATION (ORIF) ANKLE FRACTURE (Left Ankle) HARDWARE REMOVAL (Left )  Patient Location: PACU  Anesthesia Type:General  Level of Consciousness: awake, alert , oriented and patient cooperative  Airway & Oxygen Therapy: Patient Spontanous Breathing and Patient connected to face mask  Post-op Assessment: Report given to RN and Post -op Vital signs reviewed and stable  Post vital signs: Reviewed and stable  Last Vitals:  Vitals Value Taken Time  BP 129/71 05/20/19 1212  Temp    Pulse 72 05/20/19 1213  Resp 17 05/20/19 1213  SpO2 95 % 05/20/19 1213  Vitals shown include unvalidated device data.  Last Pain: There were no vitals filed for this visit.       Complications: No apparent anesthesia complications

## 2019-05-20 NOTE — Anesthesia Preprocedure Evaluation (Addendum)
Anesthesia Evaluation  Patient identified by MRN, date of birth, ID band Patient awake    Reviewed: Allergy & Precautions, NPO status , Patient's Chart, lab work & pertinent test results  Airway Mallampati: III  TM Distance: >3 FB Neck ROM: Full    Dental  (+) Missing,    Pulmonary former smoker,    Pulmonary exam normal breath sounds clear to auscultation       Cardiovascular negative cardio ROS Normal cardiovascular exam Rhythm:Regular Rate:Normal     Neuro/Psych  Headaches, PSYCHIATRIC DISORDERS Depression    GI/Hepatic negative GI ROS, Neg liver ROS,   Endo/Other  Hypothyroidism   Renal/GU negative Renal ROS     Musculoskeletal negative musculoskeletal ROS (+)   Abdominal (+) + obese,   Peds  Hematology negative hematology ROS (+)   Anesthesia Other Findings LEFT DISTAL FIBULA FRACTURE  Reproductive/Obstetrics                            Anesthesia Physical Anesthesia Plan  ASA: II  Anesthesia Plan: General   Post-op Pain Management:    Induction: Intravenous  PONV Risk Score and Plan: 3 and Midazolam, Dexamethasone, Ondansetron and Treatment may vary due to age or medical condition  Airway Management Planned: LMA  Additional Equipment:   Intra-op Plan:   Post-operative Plan: Extubation in OR  Informed Consent: I have reviewed the patients History and Physical, chart, labs and discussed the procedure including the risks, benefits and alternatives for the proposed anesthesia with the patient or authorized representative who has indicated his/her understanding and acceptance.     Dental advisory given  Plan Discussed with: CRNA  Anesthesia Plan Comments: (Patient prefers to bypass regional anesthesia for financial reasons.  )        Anesthesia Quick Evaluation

## 2019-05-22 ENCOUNTER — Encounter (HOSPITAL_COMMUNITY): Payer: Self-pay | Admitting: Orthopaedic Surgery

## 2019-10-06 ENCOUNTER — Other Ambulatory Visit: Payer: Self-pay | Admitting: Family Medicine

## 2019-10-06 DIAGNOSIS — Z1231 Encounter for screening mammogram for malignant neoplasm of breast: Secondary | ICD-10-CM

## 2019-10-30 ENCOUNTER — Ambulatory Visit
Admission: RE | Admit: 2019-10-30 | Discharge: 2019-10-30 | Disposition: A | Discharge status: Home / Self Care | Payer: Managed Care, Other (non HMO) | Source: Ambulatory Visit | Attending: Family Medicine | Admitting: Family Medicine

## 2019-10-30 ENCOUNTER — Other Ambulatory Visit: Payer: Self-pay

## 2019-10-30 DIAGNOSIS — Z1231 Encounter for screening mammogram for malignant neoplasm of breast: Secondary | ICD-10-CM

## 2020-02-02 ENCOUNTER — Other Ambulatory Visit: Payer: Self-pay | Admitting: Family Medicine

## 2020-02-02 ENCOUNTER — Other Ambulatory Visit (HOSPITAL_COMMUNITY)
Admission: RE | Admit: 2020-02-02 | Discharge: 2020-02-02 | Disposition: A | Discharge status: Home / Self Care | Payer: Managed Care, Other (non HMO) | Source: Ambulatory Visit | Attending: Family Medicine | Admitting: Family Medicine

## 2020-02-02 DIAGNOSIS — Z124 Encounter for screening for malignant neoplasm of cervix: Secondary | ICD-10-CM | POA: Insufficient documentation

## 2020-02-04 LAB — CYTOLOGY - PAP
Comment: NEGATIVE
Diagnosis: NEGATIVE
High risk HPV: NEGATIVE

## 2020-11-07 ENCOUNTER — Other Ambulatory Visit: Payer: Self-pay | Admitting: Family Medicine

## 2020-11-07 DIAGNOSIS — Z1231 Encounter for screening mammogram for malignant neoplasm of breast: Secondary | ICD-10-CM

## 2020-11-14 ENCOUNTER — Ambulatory Visit
Admission: RE | Admit: 2020-11-14 | Discharge: 2020-11-14 | Disposition: A | Discharge status: Home / Self Care | Payer: Medicaid Other | Source: Ambulatory Visit | Attending: Family Medicine | Admitting: Family Medicine

## 2020-11-14 ENCOUNTER — Other Ambulatory Visit: Payer: Self-pay

## 2020-11-14 DIAGNOSIS — Z1231 Encounter for screening mammogram for malignant neoplasm of breast: Secondary | ICD-10-CM

## 2022-02-07 DIAGNOSIS — R5381 Other malaise: Secondary | ICD-10-CM | POA: Diagnosis not present

## 2022-02-07 DIAGNOSIS — G43009 Migraine without aura, not intractable, without status migrainosus: Secondary | ICD-10-CM | POA: Diagnosis not present

## 2022-02-07 DIAGNOSIS — R42 Dizziness and giddiness: Secondary | ICD-10-CM | POA: Diagnosis not present

## 2022-02-07 DIAGNOSIS — R69 Illness, unspecified: Secondary | ICD-10-CM | POA: Diagnosis not present

## 2022-02-07 DIAGNOSIS — E038 Other specified hypothyroidism: Secondary | ICD-10-CM | POA: Diagnosis not present

## 2022-02-07 DIAGNOSIS — R7303 Prediabetes: Secondary | ICD-10-CM | POA: Diagnosis not present

## 2022-02-07 DIAGNOSIS — L709 Acne, unspecified: Secondary | ICD-10-CM | POA: Diagnosis not present

## 2022-04-10 DIAGNOSIS — R69 Illness, unspecified: Secondary | ICD-10-CM | POA: Diagnosis not present

## 2022-04-10 DIAGNOSIS — E038 Other specified hypothyroidism: Secondary | ICD-10-CM | POA: Diagnosis not present

## 2022-04-10 DIAGNOSIS — L700 Acne vulgaris: Secondary | ICD-10-CM | POA: Diagnosis not present

## 2022-04-10 DIAGNOSIS — R7303 Prediabetes: Secondary | ICD-10-CM | POA: Diagnosis not present

## 2022-04-10 DIAGNOSIS — G43009 Migraine without aura, not intractable, without status migrainosus: Secondary | ICD-10-CM | POA: Diagnosis not present

## 2022-04-10 DIAGNOSIS — Z6839 Body mass index (BMI) 39.0-39.9, adult: Secondary | ICD-10-CM | POA: Diagnosis not present

## 2022-04-23 ENCOUNTER — Other Ambulatory Visit (HOSPITAL_BASED_OUTPATIENT_CLINIC_OR_DEPARTMENT_OTHER): Payer: Self-pay | Admitting: Family Medicine

## 2022-04-23 DIAGNOSIS — Z1231 Encounter for screening mammogram for malignant neoplasm of breast: Secondary | ICD-10-CM

## 2022-04-26 ENCOUNTER — Ambulatory Visit (HOSPITAL_BASED_OUTPATIENT_CLINIC_OR_DEPARTMENT_OTHER)
Admission: RE | Admit: 2022-04-26 | Discharge: 2022-04-26 | Disposition: A | Discharge status: Home / Self Care | Payer: 59 | Source: Ambulatory Visit | Attending: Family Medicine | Admitting: Family Medicine

## 2022-04-26 ENCOUNTER — Encounter (HOSPITAL_BASED_OUTPATIENT_CLINIC_OR_DEPARTMENT_OTHER): Payer: Self-pay | Admitting: Radiology

## 2022-04-26 DIAGNOSIS — Z1231 Encounter for screening mammogram for malignant neoplasm of breast: Secondary | ICD-10-CM | POA: Diagnosis not present

## 2022-06-29 DIAGNOSIS — Z419 Encounter for procedure for purposes other than remedying health state, unspecified: Secondary | ICD-10-CM | POA: Diagnosis not present

## 2022-07-30 DIAGNOSIS — Z419 Encounter for procedure for purposes other than remedying health state, unspecified: Secondary | ICD-10-CM | POA: Diagnosis not present

## 2022-08-23 ENCOUNTER — Telehealth: Payer: Self-pay

## 2022-08-23 NOTE — Telephone Encounter (Signed)
Mychart msg sent. AS, CMA 

## 2022-08-30 DIAGNOSIS — Z419 Encounter for procedure for purposes other than remedying health state, unspecified: Secondary | ICD-10-CM | POA: Diagnosis not present

## 2022-09-10 IMAGING — MG MM DIGITAL SCREENING BILAT W/ TOMO AND CAD
8 series · 8 of 24 positions shown · non-contrast
Comparison: Previous exam(s).

CLINICAL DATA: Screening.

EXAM:
DIGITAL SCREENING BILATERAL MAMMOGRAM WITH TOMOSYNTHESIS AND CAD
TECHNIQUE: Bilateral screening digital craniocaudal and mediolateral oblique
mammograms were obtained. Bilateral screening digital breast
tomosynthesis was performed. The images were evaluated with
computer-aided detection.

[R MLO synth-2D]
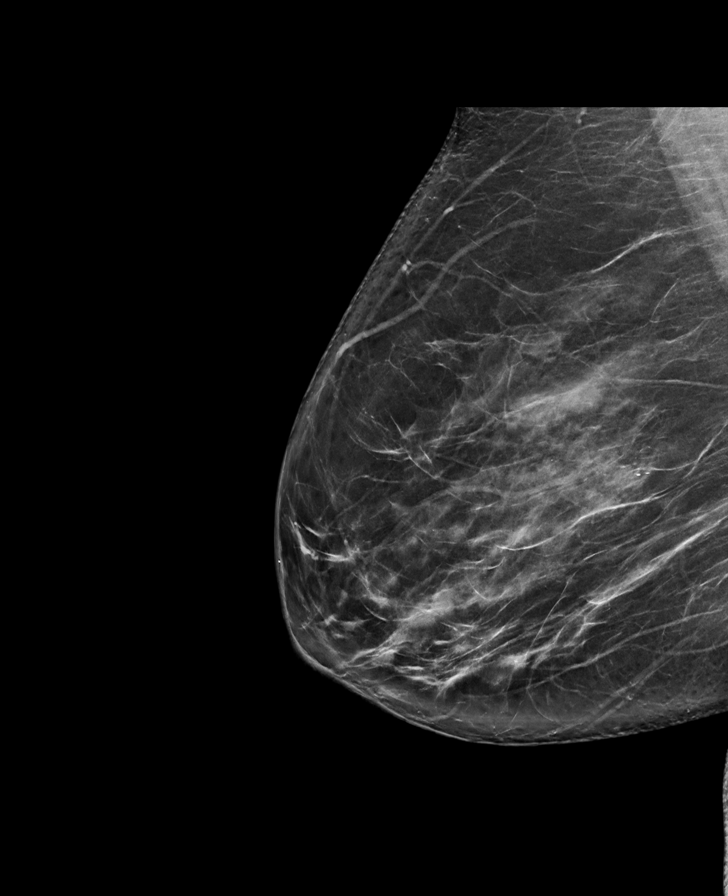

[L MLO synth-2D]
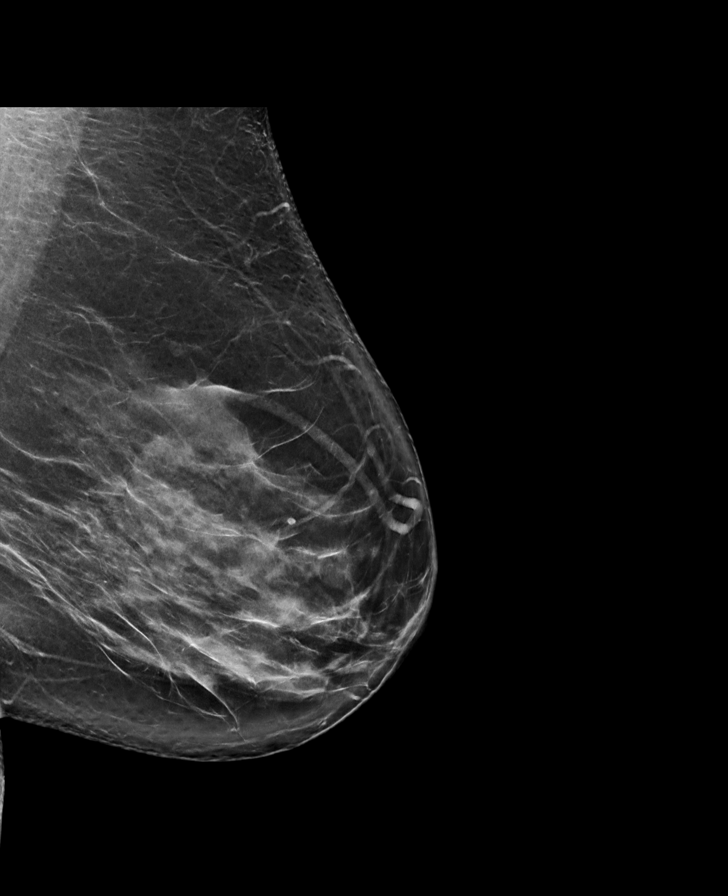

[R CC synth-2D]
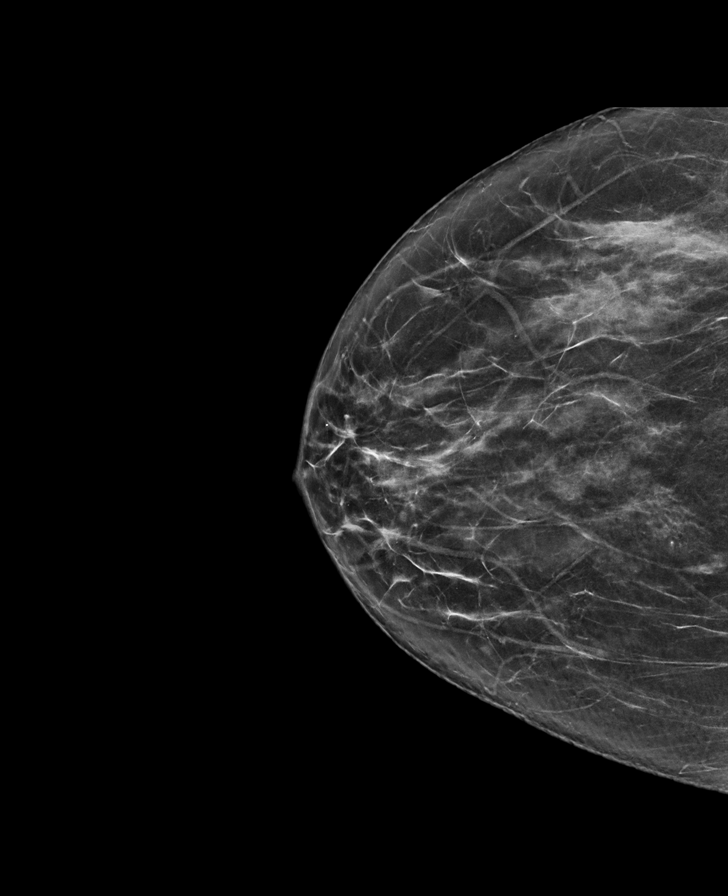

[L CC synth-2D]
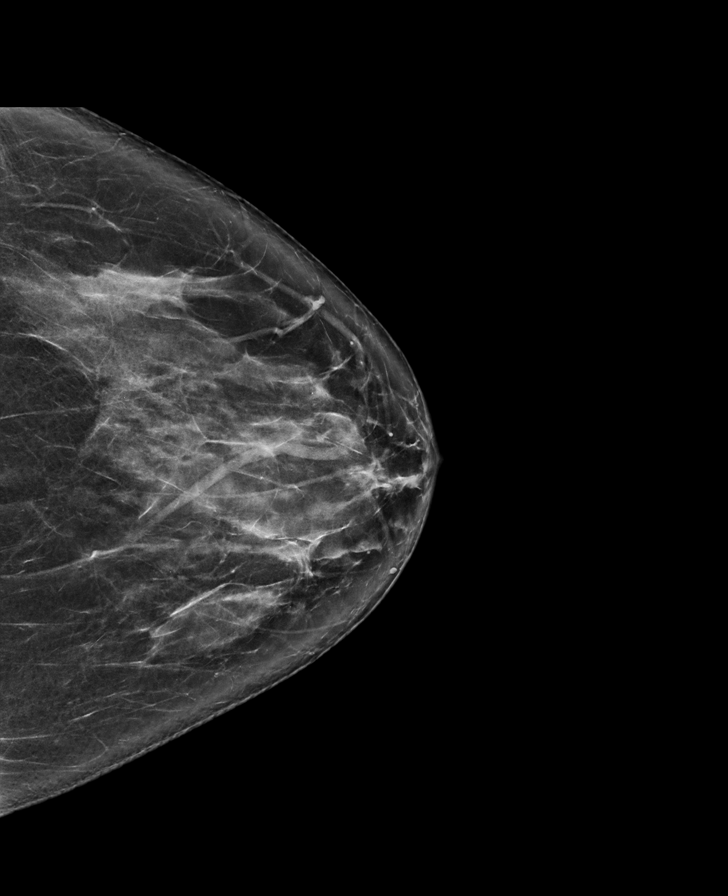

[L MLO tomo · tomo slice 45/88.0]
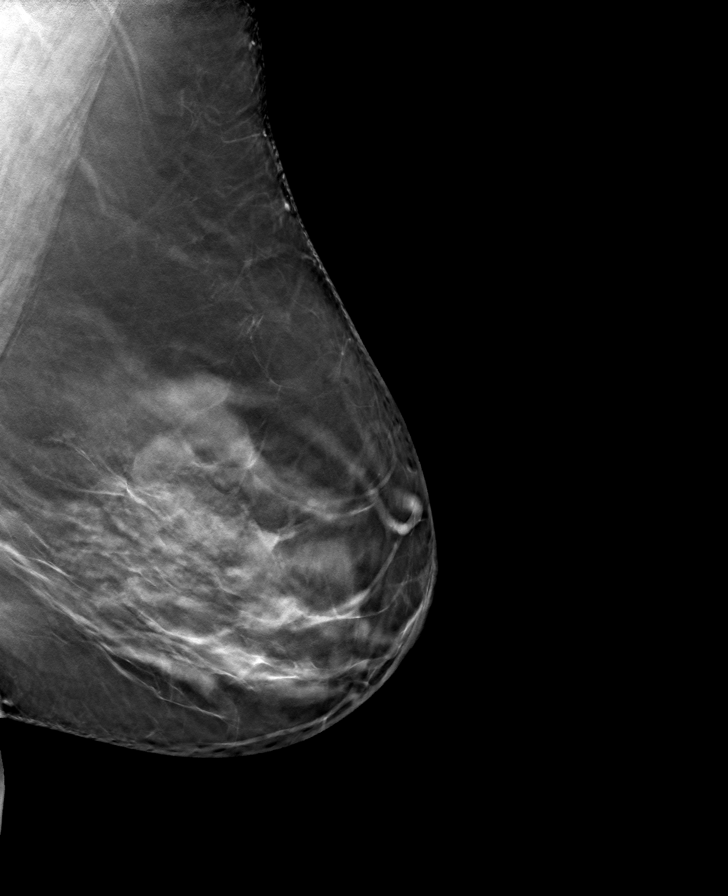

[L CC tomo · tomo slice 40/79.0]
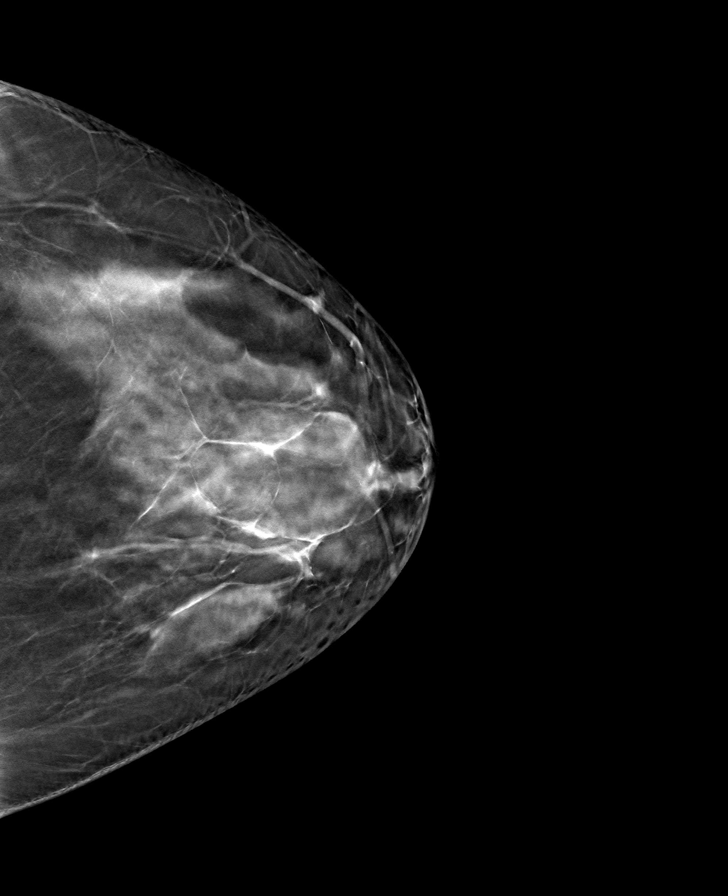

[R CC tomo · tomo slice 36/71.0]
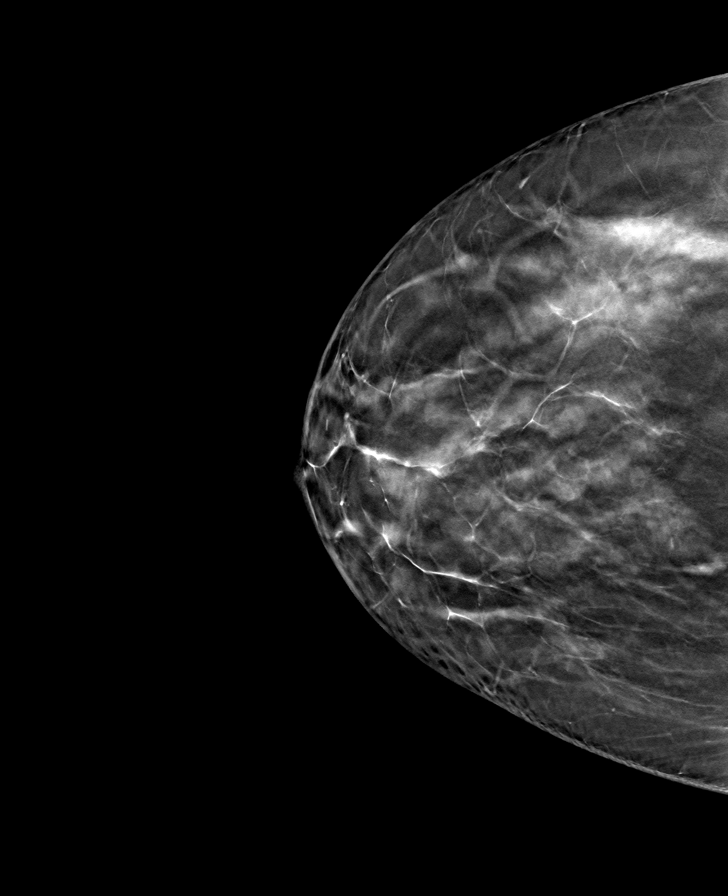

[R MLO tomo · tomo slice 42/83.0]
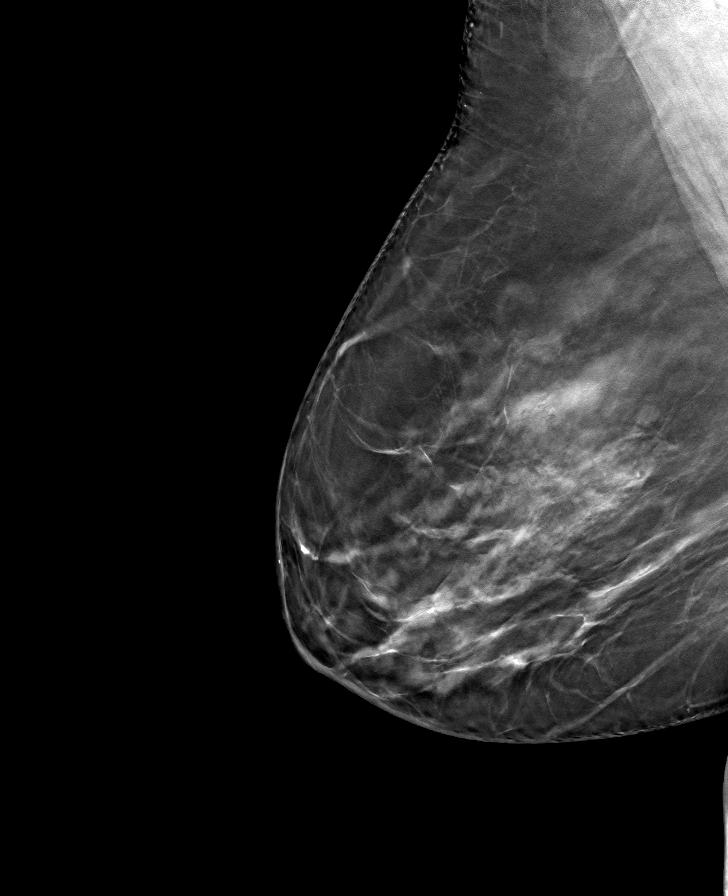

[8 of 24 positions shown; findings below may reference images not displayed]

ACR Breast Density Category c: The breast tissue is heterogeneously
dense, which may obscure small masses.
FINDINGS: There are no findings suspicious for malignancy. The images were
evaluated with computer-aided detection.
IMPRESSION: No mammographic evidence of malignancy. A result letter of this
screening mammogram will be mailed directly to the patient.

RECOMMENDATION:
Screening mammogram in one year. (Code:T4-5-GWO)

BI-RADS CATEGORY  1: Negative.

## 2022-09-28 DIAGNOSIS — Z419 Encounter for procedure for purposes other than remedying health state, unspecified: Secondary | ICD-10-CM | POA: Diagnosis not present

## 2022-10-15 DIAGNOSIS — G43009 Migraine without aura, not intractable, without status migrainosus: Secondary | ICD-10-CM | POA: Diagnosis not present

## 2022-10-15 DIAGNOSIS — F324 Major depressive disorder, single episode, in partial remission: Secondary | ICD-10-CM | POA: Diagnosis not present

## 2022-10-15 DIAGNOSIS — R7303 Prediabetes: Secondary | ICD-10-CM | POA: Diagnosis not present

## 2022-10-15 DIAGNOSIS — E039 Hypothyroidism, unspecified: Secondary | ICD-10-CM | POA: Diagnosis not present

## 2022-10-29 DIAGNOSIS — Z1212 Encounter for screening for malignant neoplasm of rectum: Secondary | ICD-10-CM | POA: Diagnosis not present

## 2022-10-29 DIAGNOSIS — Z419 Encounter for procedure for purposes other than remedying health state, unspecified: Secondary | ICD-10-CM | POA: Diagnosis not present

## 2022-10-29 DIAGNOSIS — Z1211 Encounter for screening for malignant neoplasm of colon: Secondary | ICD-10-CM | POA: Diagnosis not present

## 2022-11-28 DIAGNOSIS — Z419 Encounter for procedure for purposes other than remedying health state, unspecified: Secondary | ICD-10-CM | POA: Diagnosis not present

## 2022-12-03 DIAGNOSIS — G4733 Obstructive sleep apnea (adult) (pediatric): Secondary | ICD-10-CM | POA: Diagnosis not present

## 2022-12-29 DIAGNOSIS — Z419 Encounter for procedure for purposes other than remedying health state, unspecified: Secondary | ICD-10-CM | POA: Diagnosis not present

## 2022-12-31 DIAGNOSIS — G4733 Obstructive sleep apnea (adult) (pediatric): Secondary | ICD-10-CM | POA: Diagnosis not present

## 2023-01-21 DIAGNOSIS — G4733 Obstructive sleep apnea (adult) (pediatric): Secondary | ICD-10-CM | POA: Diagnosis not present

## 2023-01-28 DIAGNOSIS — Z419 Encounter for procedure for purposes other than remedying health state, unspecified: Secondary | ICD-10-CM | POA: Diagnosis not present

## 2023-02-20 DIAGNOSIS — G4733 Obstructive sleep apnea (adult) (pediatric): Secondary | ICD-10-CM | POA: Diagnosis not present

## 2023-02-25 DIAGNOSIS — R195 Other fecal abnormalities: Secondary | ICD-10-CM | POA: Diagnosis not present

## 2023-02-28 DIAGNOSIS — Z419 Encounter for procedure for purposes other than remedying health state, unspecified: Secondary | ICD-10-CM | POA: Diagnosis not present

## 2023-03-19 DIAGNOSIS — D125 Benign neoplasm of sigmoid colon: Secondary | ICD-10-CM | POA: Diagnosis not present

## 2023-03-19 DIAGNOSIS — D123 Benign neoplasm of transverse colon: Secondary | ICD-10-CM | POA: Diagnosis not present

## 2023-03-19 DIAGNOSIS — K635 Polyp of colon: Secondary | ICD-10-CM | POA: Diagnosis not present

## 2023-03-19 DIAGNOSIS — R195 Other fecal abnormalities: Secondary | ICD-10-CM | POA: Diagnosis not present

## 2023-03-19 DIAGNOSIS — K648 Other hemorrhoids: Secondary | ICD-10-CM | POA: Diagnosis not present

## 2023-03-19 DIAGNOSIS — D124 Benign neoplasm of descending colon: Secondary | ICD-10-CM | POA: Diagnosis not present

## 2023-03-19 DIAGNOSIS — Z1211 Encounter for screening for malignant neoplasm of colon: Secondary | ICD-10-CM | POA: Diagnosis not present

## 2023-03-31 DIAGNOSIS — Z419 Encounter for procedure for purposes other than remedying health state, unspecified: Secondary | ICD-10-CM | POA: Diagnosis not present

## 2023-04-10 DIAGNOSIS — Z1331 Encounter for screening for depression: Secondary | ICD-10-CM | POA: Diagnosis not present

## 2023-04-10 DIAGNOSIS — R61 Generalized hyperhidrosis: Secondary | ICD-10-CM | POA: Diagnosis not present

## 2023-04-10 DIAGNOSIS — Z01411 Encounter for gynecological examination (general) (routine) with abnormal findings: Secondary | ICD-10-CM | POA: Diagnosis not present

## 2023-04-10 DIAGNOSIS — Z124 Encounter for screening for malignant neoplasm of cervix: Secondary | ICD-10-CM | POA: Diagnosis not present

## 2023-05-08 DIAGNOSIS — N951 Menopausal and female climacteric states: Secondary | ICD-10-CM | POA: Diagnosis not present

## 2023-09-16 ENCOUNTER — Other Ambulatory Visit: Payer: Self-pay | Admitting: Family Medicine

## 2023-09-16 DIAGNOSIS — Z0000 Encounter for general adult medical examination without abnormal findings: Secondary | ICD-10-CM

## 2023-09-20 ENCOUNTER — Ambulatory Visit
Admission: RE | Admit: 2023-09-20 | Discharge: 2023-09-20 | Disposition: A | Discharge status: Home / Self Care | Payer: No Typology Code available for payment source | Source: Ambulatory Visit | Attending: Family Medicine | Admitting: Family Medicine

## 2023-09-20 DIAGNOSIS — Z0000 Encounter for general adult medical examination without abnormal findings: Secondary | ICD-10-CM
